# Patient Record
Sex: Female | Born: 1975 | Race: White | Hispanic: No | Marital: Single | Smoking: Current every day smoker
Health system: Southern US, Community
[De-identification: ages and names within clinical notes are randomized; demographics above are authoritative.]

## PROBLEM LIST (undated history)

## (undated) DIAGNOSIS — F32A Depression, unspecified: Secondary | ICD-10-CM

## (undated) DIAGNOSIS — F329 Major depressive disorder, single episode, unspecified: Secondary | ICD-10-CM

## (undated) HISTORY — PX: TUBAL LIGATION: SHX77

---

## 2018-09-07 ENCOUNTER — Other Ambulatory Visit: Payer: Self-pay

## 2018-09-07 ENCOUNTER — Ambulatory Visit
Admission: EM | Admit: 2018-09-07 | Discharge: 2018-09-07 | Disposition: A | Discharge status: Home / Self Care | Payer: Self-pay | Attending: Family Medicine | Admitting: Family Medicine

## 2018-09-07 DIAGNOSIS — J069 Acute upper respiratory infection, unspecified: Secondary | ICD-10-CM | POA: Insufficient documentation

## 2018-09-07 DIAGNOSIS — B9789 Other viral agents as the cause of diseases classified elsewhere: Secondary | ICD-10-CM | POA: Insufficient documentation

## 2018-09-07 HISTORY — DX: Depression, unspecified: F32.A

## 2018-09-07 HISTORY — DX: Major depressive disorder, single episode, unspecified: F32.9

## 2018-09-07 LAB — RAPID STREP SCREEN (MED CTR MEBANE ONLY): Streptococcus, Group A Screen (Direct): NEGATIVE

## 2018-09-07 NOTE — ED Provider Notes (Signed)
[facility]    CSN: [id number] Arrival date & time: 09/07/18  1134     History   Chief Complaint Chief Complaint  Patient presents with  . Sore Throat    HPI Sandra Bradshaw is a 43 y.o. female.   The history is provided by the patient.  Sore Throat   URI  Presenting symptoms: congestion, rhinorrhea and sore throat   Severity:  Moderate Onset quality:  Sudden Duration:  6 hours Timing:  Constant Progression:  Unchanged Chronicity:  New Relieved by:  None tried Ineffective treatments:  None tried Associated symptoms: no wheezing   Risk factors: sick contacts   Risk factors: not elderly and no chronic cardiac disease     Past Medical History:  Diagnosis Date  . Depression     There are no active problems to display for this patient.   Past Surgical History:  Procedure Laterality Date  . TUBAL LIGATION      OB History   No obstetric history on file.      Home Medications    Prior to Admission medications   Not on File    Family History History reviewed. No pertinent family history.  Social History Social History   Tobacco Use  . Smoking status: Former Smoker    Last attempt to quit: 08/19/2018    Years since quitting: 0.0  . Smokeless tobacco: Never Used  Substance Use Topics  . Alcohol use: Yes    Comment: occasional  . Drug use: Not Currently     Allergies   Doxycycline and Codeine   Review of Systems Review of Systems  HENT: Positive for congestion, rhinorrhea and sore throat.   Respiratory: Negative for wheezing.      Physical Exam Triage Vital Signs ED Triage Vitals  Enc Vitals Group     BP 09/07/18 1158 129/86     Pulse Rate 09/07/18 1158 70     Resp 09/07/18 1158 16     Temp 09/07/18 1158 98.4 F (36.9 C)     Temp Source 09/07/18 1158 Oral     SpO2 09/07/18 1158 100 %     Weight 09/07/18 1156 167 lb (75.8 kg)     Height 09/07/18 1156 5\' 7"  (1.702 m)     Head Circumference --      Peak Flow --    Pain Score 09/07/18 1156 4     Pain Loc --      Pain Edu? --      Excl. in GC? --    No data found.  Updated Vital Signs BP 129/86 (BP Location: Left Arm)   Pulse 70   Temp 98.4 F (36.9 C) (Oral)   Resp 16   Ht 5\' 7"  (1.702 m)   Wt 75.8 kg   LMP 08/10/2018   SpO2 100%   BMI 26.16 kg/m   Visual Acuity Right Eye Distance:   Left Eye Distance:   Bilateral Distance:    Right Eye Near:   Left Eye Near:    Bilateral Near:     Physical Exam Vitals signs and nursing note reviewed.  Constitutional:      General: She is not in acute distress.    Appearance: She is well-developed. She is not diaphoretic.  HENT:     Head: Normocephalic and atraumatic.     Right Ear: Tympanic membrane, ear canal and external ear normal.     Left Ear: Tympanic membrane, ear canal and external ear normal.  Nose: Rhinorrhea present.     Mouth/Throat:     Pharynx: Uvula midline. Posterior oropharyngeal erythema present. No oropharyngeal exudate.  Neck:     Musculoskeletal: Normal range of motion and neck supple.     Thyroid: No thyromegaly.  Cardiovascular:     Rate and Rhythm: Normal rate and regular rhythm.     Heart sounds: Normal heart sounds.  Pulmonary:     Effort: Pulmonary effort is normal. No respiratory distress.     Breath sounds: Normal breath sounds. No stridor. No wheezing, rhonchi or rales.  Lymphadenopathy:     Cervical: No cervical adenopathy.      UC Treatments / Results  Labs (all labs ordered are listed, but only abnormal results are displayed) Labs Reviewed  RAPID STREP SCREEN ([facility] ONLY)  CULTURE, GROUP A STREP [facility])    EKG None  Radiology No results found.  Procedures Procedures (including critical care time)  Medications Ordered in UC Medications - No data to display  Initial Impression / Assessment and Plan / UC Course  I have reviewed the triage vital signs and the nursing notes.  Pertinent labs & imaging results that were  available during my care of the patient were reviewed by me and considered in my medical decision making (see chart for details).      Final Clinical Impressions(s) / UC Diagnoses   Final diagnoses:  Viral URI with cough    ED Prescriptions    None     1. Lab results and diagnosis reviewed with patient 2. Recommend supportive treatment with rest, fluids, otc meds prn 3. Follow-up prn if symptoms worsen or don't improve   Controlled Substance Prescriptions  Controlled Substance Registry consulted? Not Applicable   Payton Mccallum, MD 09/07/18 [phone]

## 2018-09-07 NOTE — ED Triage Notes (Signed)
Pt awoke today with sore, red throat, swollen lymph nodes and nausea. No fever.

## 2018-09-10 LAB — CULTURE, GROUP A STREP (THRC)

## 2020-07-17 ENCOUNTER — Ambulatory Visit
Admission: EM | Admit: 2020-07-17 | Discharge: 2020-07-17 | Disposition: A | Discharge status: Home / Self Care | Payer: Self-pay | Attending: Emergency Medicine | Admitting: Emergency Medicine

## 2020-07-17 ENCOUNTER — Other Ambulatory Visit: Payer: Self-pay

## 2020-07-17 DIAGNOSIS — Z87891 Personal history of nicotine dependence: Secondary | ICD-10-CM | POA: Insufficient documentation

## 2020-07-17 DIAGNOSIS — Z881 Allergy status to other antibiotic agents status: Secondary | ICD-10-CM | POA: Insufficient documentation

## 2020-07-17 DIAGNOSIS — J40 Bronchitis, not specified as acute or chronic: Secondary | ICD-10-CM | POA: Insufficient documentation

## 2020-07-17 DIAGNOSIS — R059 Cough, unspecified: Secondary | ICD-10-CM | POA: Insufficient documentation

## 2020-07-17 DIAGNOSIS — Z20822 Contact with and (suspected) exposure to covid-19: Secondary | ICD-10-CM | POA: Insufficient documentation

## 2020-07-17 DIAGNOSIS — Z885 Allergy status to narcotic agent status: Secondary | ICD-10-CM | POA: Insufficient documentation

## 2020-07-17 LAB — RESP PANEL BY RT-PCR (FLU A&B, COVID) ARPGX2
Influenza A by PCR: NEGATIVE
Influenza B by PCR: NEGATIVE
SARS Coronavirus 2 by RT PCR: NEGATIVE

## 2020-07-17 MED ORDER — PROMETHAZINE-DM 6.25-15 MG/5ML PO SYRP: 5.0000 mL | ORAL_SOLUTION | Freq: Four times a day (QID) | ORAL | 0 refills | Status: DC | PRN

## 2020-07-17 MED ORDER — PREDNISONE 10 MG (21) PO TBPK: ORAL_TABLET | Freq: Every day | ORAL | 0 refills | Status: DC

## 2020-07-17 MED ORDER — BENZONATATE 100 MG PO CAPS: 200.0000 mg | ORAL_CAPSULE | Freq: Three times a day (TID) | ORAL | 0 refills | Status: DC

## 2020-07-17 NOTE — Discharge Instructions (Addendum)
Take the prednisone according to the package insert.  Use the Tessalon Perles during the day for cough, take them with a small sip of water.  Use the Promethazine DM at bedtime for cough, congestion, and sleep.  Increase oral fluid intake to keep your mucus thin.  If your symptoms persist follow-up with your primary care provider.

## 2020-07-17 NOTE — ED Triage Notes (Signed)
Patient states that she has been having cough with thick mucus x 5 days. States that she was covid tested on Wednesday when her only symptom was sneezing. Patient states that she has not been feeling well and has some sinus issues. Will re swab for covid and flu today.

## 2020-07-17 NOTE — ED Provider Notes (Signed)
[facility]    CSN: [id number] Arrival date & time: 07/17/20  1157      History   Chief Complaint Chief Complaint  Patient presents with  . Cough    HPI Sandra Bradshaw is a 44 y.o. female.   HPI   44 year old female here for evaluation of productive cough.  Patient reports that she has had symptoms for the past 5 days.  She states her cough is worse when she is laying flat and that she is bringing up green mucus.  Patient has had some sinus pain and complains of pain little hard of hearing the past few days.  She also complaining of chest tightness.  Patient denies fever, nausea, vomiting, diarrhea, shortness of breath, or wheezing.  Patient has not received her flu shot or her Covid vaccine.  Past Medical History:  Diagnosis Date  . Depression     There are no problems to display for this patient.   Past Surgical History:  Procedure Laterality Date  . TUBAL LIGATION      OB History   No obstetric history on file.      Home Medications    Prior to Admission medications   Medication Sig Start Date End Date Taking? Authorizing Provider  benzonatate (TESSALON) 100 MG capsule Take 2 capsules (200 mg total) by mouth every 8 (eight) hours. 07/17/20   Becky Augusta, NP  predniSONE (STERAPRED UNI-PAK 21 TAB) 10 MG (21) TBPK tablet Take by mouth daily. Take 6 tabs by mouth daily  for 2 days, then 5 tabs for 2 days, then 4 tabs for 2 days, then 3 tabs for 2 days, 2 tabs for 2 days, then 1 tab by mouth daily for 2 days 07/17/20   Becky Augusta, NP  promethazine-dextromethorphan (PROMETHAZINE-DM) 6.25-15 MG/5ML syrup Take 5 mLs by mouth 4 (four) times daily as needed. 07/17/20   Becky Augusta, NP    Family History History reviewed. No pertinent family history.  Social History Social History   Tobacco Use  . Smoking status: Former Smoker    Quit date: 08/19/2018    Years since quitting: 1.9  . Smokeless tobacco: Never Used  Vaping Use  . Vaping Use: Every  day  Substance Use Topics  . Alcohol use: Yes    Comment: occasional  . Drug use: Not Currently     Allergies   Doxycycline and Codeine   Review of Systems Review of Systems  Constitutional: Negative for activity change, appetite change and fever.  HENT: Positive for congestion, hearing loss, sinus pressure and sinus pain. Negative for rhinorrhea and sore throat.   Respiratory: Positive for cough and chest tightness. Negative for shortness of breath and wheezing.   Cardiovascular: Negative for chest pain.  Gastrointestinal: Negative for diarrhea, nausea and vomiting.  Musculoskeletal: Negative for arthralgias and myalgias.  Skin: Negative for rash.  Neurological: Negative for syncope and headaches.  Hematological: Negative.   Psychiatric/Behavioral: Negative.      Physical Exam Triage Vital Signs ED Triage Vitals  Enc Vitals Group     BP --      Pulse --      Resp 07/17/20 1521 18     Temp --      Temp Source 07/17/20 1521 Oral     SpO2 --      Weight 07/17/20 1519 180 lb (81.6 kg)     Height 07/17/20 1519 5\' 8"  (1.727 m)     Head Circumference --  Peak Flow --      Pain Score 07/17/20 1519 2     Pain Loc --      Pain Edu? --      Excl. in GC? --    No data found.  Updated Vital Signs BP (!) 152/73 (BP Location: Right Arm)   Pulse 72   Temp 98.2 F (36.8 C) (Oral)   Resp 18   Ht 5\' 8"  (1.727 m)   Wt 180 lb (81.6 kg)   LMP 06/23/2020   SpO2 98%   BMI 27.37 kg/m   Visual Acuity Right Eye Distance:   Left Eye Distance:   Bilateral Distance:    Right Eye Near:   Left Eye Near:    Bilateral Near:     Physical Exam Vitals and nursing note reviewed.  Constitutional:      General: She is not in acute distress.    Appearance: Normal appearance. She is not toxic-appearing.  HENT:     Head: Normocephalic and atraumatic.     Right Ear: Tympanic membrane, ear canal and external ear normal.     Left Ear: Tympanic membrane, ear canal and external ear  normal.     Nose: Rhinorrhea present. No congestion.     Comments: Nasal mucosa is mildly edematous without erythema.  There is scant clear nasal discharge present.    Mouth/Throat:     Mouth: Mucous membranes are moist.     Pharynx: Oropharynx is clear. Posterior oropharyngeal erythema present. No oropharyngeal exudate.     Comments: Posterior oropharynx has mild erythema with clear postnasal drip.  No injection. Eyes:     General: No scleral icterus.    Extraocular Movements: Extraocular movements intact.     Conjunctiva/sclera: Conjunctivae normal.     Pupils: Pupils are equal, round, and reactive to light.  Cardiovascular:     Rate and Rhythm: Normal rate and regular rhythm.     Pulses: Normal pulses.     Heart sounds: Normal heart sounds. No murmur heard.  No gallop.   Pulmonary:     Effort: Pulmonary effort is normal.     Breath sounds: Normal breath sounds. No wheezing, rhonchi or rales.  Musculoskeletal:        General: No swelling or tenderness. Normal range of motion.     Cervical back: Normal range of motion and neck supple. No tenderness.  Lymphadenopathy:     Cervical: No cervical adenopathy.  Skin:    General: Skin is warm and dry.     Capillary Refill: Capillary refill takes less than 2 seconds.     Coloration: Skin is not jaundiced.     Findings: No erythema, lesion or rash.  Neurological:     General: No focal deficit present.     Mental Status: She is alert and oriented to person, place, and time.  Psychiatric:        Mood and Affect: Mood normal.     Comments: Patient is very antsy and unable to sit still during interview and exam.  Patient also having a hard time recounting the timeline of her illness.      UC Treatments / Results  Labs (all labs ordered are listed, but only abnormal results are displayed) Labs Reviewed  RESP PANEL BY RT-PCR (FLU A&B, COVID) [id number]    EKG   Radiology No results found.  Procedures Procedures (including critical  care time)  Medications Ordered in UC Medications - No data to display  Initial Impression /  Assessment and Plan / UC Course  I have reviewed the triage vital signs and the nursing notes.  Pertinent labs & imaging results that were available during my care of the patient were reviewed by me and considered in my medical decision making (see chart for details).   Patient is here for evaluation of a productive cough for thick green sputum that she has had for the past 5 days.  Patient has not had a fever.  She does complain of some sinus pain and stating that gets been trouble hearing in the past few days.  Physical exam is largely unremarkable.  Patient does work in a [employer].  Patient was tested for Covid 3 days ago and was negative.  Will recheck for Covid and flu giving that patient has had neither vaccine but it is less likely given her lack of fever.  Symptoms more consistent with forming bronchitis as patient is a smoker.  We will send [facility]plex respiratory panel.  [facility]plex respiratory panel is negative.  Will discharge patient home with treatment for bronchitis with prednisone, Tessalon Perles, and Promethazine DM.   Final Clinical Impressions(s) / UC Diagnoses   Final diagnoses:  Bronchitis     Discharge Instructions     Take the prednisone according to the package insert.  Use the Tessalon Perles during the day for cough, take them with a small sip of water.  Use the Promethazine DM at bedtime for cough, congestion, and sleep.  Increase oral fluid intake to keep your mucus thin.  If your symptoms persist follow-up with your primary care provider.    ED Prescriptions    Medication Sig Dispense Auth. Provider   predniSONE (STERAPRED UNI-PAK 21 TAB) 10 MG (21) TBPK tablet Take by mouth daily. Take 6 tabs by mouth daily  for 2 days, then 5 tabs for 2 days, then 4 tabs for 2 days, then 3 tabs for 2 days, 2 tabs for 2 days, then 1 tab by mouth daily for 2 days 42 tablet  Becky Augusta, NP   benzonatate (TESSALON) 100 MG capsule Take 2 capsules (200 mg total) by mouth every 8 (eight) hours. 21 capsule Becky Augusta, NP   promethazine-dextromethorphan (PROMETHAZINE-DM) 6.25-15 MG/5ML syrup Take 5 mLs by mouth 4 (four) times daily as needed. 118 mL Becky Augusta, NP     PDMP not reviewed this encounter.   Becky Augusta, NP 07/17/20 1631

## 2021-06-09 ENCOUNTER — Emergency Department: Payer: Self-pay

## 2021-06-09 ENCOUNTER — Other Ambulatory Visit: Payer: Self-pay

## 2021-06-09 DIAGNOSIS — S01111A Laceration without foreign body of right eyelid and periocular area, initial encounter: Secondary | ICD-10-CM | POA: Insufficient documentation

## 2021-06-09 DIAGNOSIS — Z23 Encounter for immunization: Secondary | ICD-10-CM | POA: Insufficient documentation

## 2021-06-09 DIAGNOSIS — Z87891 Personal history of nicotine dependence: Secondary | ICD-10-CM | POA: Insufficient documentation
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
## 2021-06-10 ENCOUNTER — Emergency Department
Admission: EM | Admit: 2021-06-10 | Discharge: 2021-06-10 | Disposition: A | Discharge status: Home / Self Care | Payer: Self-pay | Attending: Emergency Medicine | Admitting: Emergency Medicine

## 2021-06-10 DIAGNOSIS — S0990XA Unspecified injury of head, initial encounter: Secondary | ICD-10-CM

## 2021-06-10 DIAGNOSIS — S0181XA Laceration without foreign body of other part of head, initial encounter: Secondary | ICD-10-CM

## 2021-06-10 MED ORDER — FLUORESCEIN SODIUM 1 MG OP STRP
1.0000 | ORAL_STRIP | Freq: Once | OPHTHALMIC | Status: AC
  Administered 2021-06-10: 1 via OPHTHALMIC
  Filled 2021-06-10: qty 1

## 2021-06-10 MED ORDER — IBUPROFEN 600 MG PO TABS
600.0000 mg | ORAL_TABLET | Freq: Once | ORAL | Status: AC
  Administered 2021-06-10: 600 mg via ORAL
  Filled 2021-06-10: qty 1

## 2021-06-10 MED ORDER — TETANUS-DIPHTH-ACELL PERTUSSIS 5-2.5-18.5 LF-MCG/0.5 IM SUSY
0.5000 mL | PREFILLED_SYRINGE | Freq: Once | INTRAMUSCULAR | Status: AC
  Administered 2021-06-10: 0.5 mL via INTRAMUSCULAR
  Filled 2021-06-10: qty 0.5

## 2021-06-10 MED ORDER — OXYCODONE HCL 5 MG PO TABS
5.0000 mg | ORAL_TABLET | Freq: Once | ORAL | Status: AC
  Administered 2021-06-10: 5 mg via ORAL
  Filled 2021-06-10: qty 1

## 2021-06-10 MED ORDER — ACETAMINOPHEN 500 MG PO TABS
1000.0000 mg | ORAL_TABLET | Freq: Once | ORAL | Status: AC
  Administered 2021-06-10: 1000 mg via ORAL
  Filled 2021-06-10: qty 2

## 2021-06-10 MED ORDER — LIDOCAINE HCL (PF) 1 % IJ SOLN
10.0000 mL | Freq: Once | INTRAMUSCULAR | Status: AC
  Administered 2021-06-10: 10 mL
  Filled 2021-06-10: qty 10

## 2021-06-10 MED ORDER — TETRACAINE HCL 0.5 % OP SOLN
1.0000 [drp] | Freq: Once | OPHTHALMIC | Status: AC
  Administered 2021-06-10: 1 [drp] via OPHTHALMIC
  Filled 2021-06-10: qty 4

## 2021-06-10 NOTE — ED Notes (Signed)
Pt left without paperwork
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
## 2021-06-10 NOTE — Discharge Instructions (Signed)
The stitches will need to be removed in about 7-10 days.  This can be done at any doctor's office, urgent care or out front in the emergency room.  If you develop any severe pain, inability to see or use your right eyeball, please return to the ED.  If you develop any fevers or pus coming from the forehead wound, please return to the ED.  Please take Tylenol and ibuprofen/Advil for your pain.  It is safe to take them together, or to alternate them every few hours.  Take up to 1000mg  of Tylenol at a time, up to 4 times per day.  Do not take more than 4000 mg of Tylenol in 24 hours.  For ibuprofen, take 400-600 mg, 4-5 times per day.

## 2022-07-03 ENCOUNTER — Encounter: Payer: Self-pay | Admitting: Emergency Medicine

## 2022-07-03 ENCOUNTER — Ambulatory Visit
Admission: EM | Admit: 2022-07-03 | Discharge: 2022-07-03 | Disposition: A | Discharge status: Home / Self Care | Payer: 59

## 2022-07-03 ENCOUNTER — Ambulatory Visit (INDEPENDENT_AMBULATORY_CARE_PROVIDER_SITE_OTHER): Payer: 59

## 2022-07-03 DIAGNOSIS — R1084 Generalized abdominal pain: Secondary | ICD-10-CM | POA: Diagnosis not present

## 2022-07-03 DIAGNOSIS — K5900 Constipation, unspecified: Secondary | ICD-10-CM | POA: Diagnosis not present

## 2022-07-03 DIAGNOSIS — R109 Unspecified abdominal pain: Secondary | ICD-10-CM

## 2022-07-03 MED ORDER — LACTULOSE 10 GM/15ML PO SOLN: 10.0000 g | Freq: Two times a day (BID) | ORAL | 0 refills | Status: DC | PRN

## 2022-07-03 NOTE — Discharge Instructions (Addendum)
-  X-ray shows that you are constipated but no bowel blockages. - Continue to exercise and increase her fluid intake.  Start a daily fiber supplement like Metamucil.  I have sent lactulose to the pharmacy.  Make sure you are drinking plenty of fluids with this medication. - If you are still having issues with constipation in the next week, please follow-up with your PCP as you may need a referral to GI and possible colonoscopy. - If you have increased abdominal pain, fever or stop being able to produce a BM at all even with laxatives, go to ER.

## 2022-07-03 NOTE — ED Provider Notes (Signed)
[facility]    CSN: [id number] Arrival date & time: 07/03/22  1313      History   Chief Complaint Chief Complaint  Patient presents with   Constipation    HPI Sandra Bradshaw is a 46 y.o. female presenting for issues with constipation for the past 1 to 1-1/2 weeks.  She states she started to have abdominal discomfort and noticed that she had not had a BM in a week.  Reports that she took MiraLAX, stool softeners, Senokot and also her father's lactulose and she was finally able to have a BM on Saturday after taking 2 doses of lactulose.  Reports she has not had a BM since Saturday and believes she is still constipated.  She reports increased abdominal swelling and cramping.  Generalized abdominal pain.  Reports no issues with constipation.  No changes to her medications or foods.  States that she drinks tons of fluids and walks throughout the day.  States she is following every recommendation she can but is still having issues producing a BM.  No history of GI issues.  No fever, fatigue, nausea/vomiting.  No report of rectal pain or bleeding.  HPI  Past Medical History:  Diagnosis Date   Depression     There are no problems to display for this patient.   Past Surgical History:  Procedure Laterality Date   TUBAL LIGATION      OB History   No obstetric history on file.      Home Medications    Prior to Admission medications   Medication Sig Start Date End Date Taking? Authorizing Provider  cloNIDine (CATAPRES) 0.1 MG tablet Take 0.1 mg by mouth at bedtime.   Yes [provider]  lactulose (CHRONULAC) 10 GM/15ML solution Take 15 mLs (10 g total) by mouth 2 (two) times daily as needed for mild constipation. 07/03/22  Yes Laurene Footman B, PA-C  liraglutide (VICTOZA) 18 MG/3ML SOPN Inject into the skin daily.   Yes [provider]  losartan (COZAAR) 50 MG tablet Take 50 mg by mouth daily.   Yes [provider]  phentermine (ADIPEX-P) 37.5  MG tablet Take 37.5 mg by mouth daily before breakfast.   Yes [provider]  benzonatate (TESSALON) 100 MG capsule Take 2 capsules (200 mg total) by mouth every 8 (eight) hours. 07/17/20   Margarette Canada, NP  predniSONE (STERAPRED UNI-PAK 21 TAB) 10 MG (21) TBPK tablet Take by mouth daily. Take 6 tabs by mouth daily  for 2 days, then 5 tabs for 2 days, then 4 tabs for 2 days, then 3 tabs for 2 days, 2 tabs for 2 days, then 1 tab by mouth daily for 2 days 07/17/20   Margarette Canada, NP  promethazine-dextromethorphan (PROMETHAZINE-DM) 6.25-15 MG/5ML syrup Take 5 mLs by mouth 4 (four) times daily as needed. 07/17/20   Margarette Canada, NP    Family History No family history on file.  Social History Social History   Tobacco Use   Smoking status: Every Day    Types: Cigarettes    Last attempt to quit: 08/19/2018    Years since quitting: 3.8   Smokeless tobacco: Never  Vaping Use   Vaping Use: Every day  Substance Use Topics   Alcohol use: Yes    Comment: occasional   Drug use: Not Currently     Allergies   Doxycycline, Codeine, and Latex   Review of Systems Review of Systems  Constitutional:  Negative for fatigue and fever.  Gastrointestinal:  Positive for abdominal distention, abdominal pain and constipation. Negative for anal bleeding, blood in stool, diarrhea, nausea, rectal pain and vomiting.  Genitourinary:  Negative for difficulty urinating and flank pain.  Musculoskeletal:  Negative for back pain.  Neurological:  Negative for weakness.     Physical Exam Triage Vital Signs ED Triage Vitals  Enc Vitals Group     BP 07/03/22 1349 116/81     Pulse Rate 07/03/22 1349 71     Resp 07/03/22 1349 16     Temp 07/03/22 1349 97.8 F (36.6 C)     Temp Source 07/03/22 1349 Oral     SpO2 07/03/22 1349 97 %     Weight 07/03/22 1346 184 lb 15.5 oz (83.9 kg)     Height 07/03/22 1346 5\' 7"  (1.702 m)     Head Circumference --      Peak Flow --      Pain Score 07/03/22 1344 6      Pain Loc --      Pain Edu? --      Excl. in [place]? --    No data found.  Updated Vital Signs BP 116/81 (BP Location: Left Arm)   Pulse 71   Temp 97.8 F (36.6 C) (Oral)   Resp 16   Ht 5\' 7"  (1.702 m)   Wt 184 lb 15.5 oz (83.9 kg)   LMP 05/22/2022 (Approximate) Comment: denies preg, signed waiver,  SpO2 97%   BMI 28.97 kg/m       Physical Exam Vitals and nursing note reviewed.  Constitutional:      General: She is not in acute distress.    Appearance: Normal appearance. She is not ill-appearing or toxic-appearing.  HENT:     Head: Normocephalic and atraumatic.  Eyes:     General: No scleral icterus.       Right eye: No discharge.        Left eye: No discharge.     Conjunctiva/sclera: Conjunctivae normal.  Cardiovascular:     Rate and Rhythm: Normal rate and regular rhythm.     Heart sounds: Normal heart sounds.  Pulmonary:     Effort: Pulmonary effort is normal. No respiratory distress.     Breath sounds: Normal breath sounds.  Abdominal:     General: Bowel sounds are normal.     Palpations: Abdomen is soft.     Tenderness: There is abdominal tenderness (generalized).  Musculoskeletal:     Cervical back: Neck supple.  Skin:    General: Skin is dry.  Neurological:     General: No focal deficit present.     Mental Status: She is alert. Mental status is at baseline.     Motor: No weakness.     Gait: Gait normal.  Psychiatric:        Mood and Affect: Mood normal.        Behavior: Behavior normal.        Thought Content: Thought content normal.      UC Treatments / Results  Labs (all labs ordered are listed, but only abnormal results are displayed) Labs Reviewed - No data to display  EKG   Radiology DG Abdomen 1 View  Result Date: 07/03/2022 CLINICAL DATA:  Abdominal pain and swelling. Constipation. Symptoms for 1 week. EXAM: ABDOMEN - 1 VIEW COMPARISON:  None Available. FINDINGS: Two supine views of the abdomen are submitted. There is prominent stool  within the transverse colon. The bowel gas pattern is nonobstructive. There is no  supine evidence of bowel wall thickening or pneumoperitoneum. Punctate calcifications in the pelvis are likely phleboliths. The bones appear unremarkable. IMPRESSION: Prominent stool in the transverse colon consistent with constipation. No evidence of bowel obstruction. Electronically Signed   By: Carey Bullocks M.D.   On: 07/03/2022 14:03    Procedures Procedures (including critical care time)  Medications Ordered in UC Medications - No data to display  Initial Impression / Assessment and Plan / UC Course  I have reviewed the triage vital signs and the nursing notes.  Pertinent labs & imaging results that were available during my care of the patient were reviewed by me and considered in my medical decision making (see chart for details).   46 year old female presents for constipation issues for the past 1 to 1-1/2 weeks.  Reports being constipated for 1 week and taking numerous laxatives and stool softeners and eventually her father's lactulose medication and being able to produce a BM 3 days ago.  No BM since.  Reports continued abdominal cramping and pain.  Reports that she took lactulose yesterday again but has not had a BM since.  No fever, vomiting  Vitals normal and stable patient is overall well-appearing.  Normal bowel sounds, may be increased in the upper left quadrant.  Abdomen is soft and she does have generalized tenderness to palpation.  KUB ordered to assess for possible SBO.  KUB shows prominent stool but no obstruction.  Discussed results of imaging with patient.  Encouraged her to stay hydrated and take daily fiber supplement as well as exercise regularly.  Will prescribe lactulose since that seems to be only thing that has helped her.  Encouraged her to hydrate well with this medication.  Advised her to follow-up with her PCP if she still having constipation issues that she may need a colonoscopy.   ED precautions reviewed.   Final Clinical Impressions(s) / UC Diagnoses   Final diagnoses:  Constipation, unspecified constipation type  Generalized abdominal pain     Discharge Instructions      -X-ray shows that you are constipated but no bowel blockages. - Continue to exercise and increase her fluid intake.  Start a daily fiber supplement like Metamucil.  I have sent lactulose to the pharmacy.  Make sure you are drinking plenty of fluids with this medication. - If you are still having issues with constipation in the next week, please follow-up with your PCP as you may need a referral to GI and possible colonoscopy. - If you have increased abdominal pain, fever or stop being able to produce a BM at all even with laxatives, go to ER.     ED Prescriptions     Medication Sig Dispense Auth. Provider   lactulose (CHRONULAC) 10 GM/15ML solution Take 15 mLs (10 g total) by mouth 2 (two) times daily as needed for mild constipation. 237 mL Shirlee Latch, PA-C      PDMP not reviewed this encounter.   Shirlee Latch, PA-C 07/03/22 1433

## 2022-07-03 NOTE — ED Triage Notes (Signed)
Pt c/o constipation. Started about a week ago. She states she has tried miralax, stool softener, her fathers constipation medication. She was able to have a BM 4 days ago but has not been able to since. She states her abdomen hurts and is swollen.
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
## 2024-05-17 ENCOUNTER — Other Ambulatory Visit: Payer: Self-pay | Admitting: Medical Genetics

## 2024-05-24 ENCOUNTER — Other Ambulatory Visit: Payer: Self-pay

## 2024-05-24 ENCOUNTER — Other Ambulatory Visit
Admission: RE | Admit: 2024-05-24 | Discharge: 2024-05-24 | Disposition: A | Discharge status: Home / Self Care | Payer: Self-pay | Source: Ambulatory Visit | Attending: Medical Genetics | Admitting: Medical Genetics

## 2024-06-04 LAB — GENECONNECT MOLECULAR SCREEN: Genetic Analysis Overall Interpretation: NEGATIVE

## 2024-09-12 ENCOUNTER — Encounter (HOSPITAL_COMMUNITY): Payer: Self-pay

## 2024-09-12 ENCOUNTER — Emergency Department (HOSPITAL_COMMUNITY)
Admission: EM | Admit: 2024-09-12 | Discharge: 2024-09-13 | Disposition: A | Payer: MEDICAID | Attending: Emergency Medicine | Admitting: Emergency Medicine

## 2024-09-12 ENCOUNTER — Emergency Department (HOSPITAL_COMMUNITY): Payer: MEDICAID

## 2024-09-12 ENCOUNTER — Other Ambulatory Visit: Payer: Self-pay

## 2024-09-12 DIAGNOSIS — R45851 Suicidal ideations: Secondary | ICD-10-CM | POA: Insufficient documentation

## 2024-09-12 DIAGNOSIS — Z91040 Latex allergy status: Secondary | ICD-10-CM | POA: Insufficient documentation

## 2024-09-12 DIAGNOSIS — S0592XA Unspecified injury of left eye and orbit, initial encounter: Secondary | ICD-10-CM | POA: Diagnosis present

## 2024-09-12 DIAGNOSIS — S0012XA Contusion of left eyelid and periocular area, initial encounter: Secondary | ICD-10-CM | POA: Insufficient documentation

## 2024-09-12 DIAGNOSIS — Y907 Blood alcohol level of 200-239 mg/100 ml: Secondary | ICD-10-CM | POA: Diagnosis not present

## 2024-09-12 DIAGNOSIS — F10921 Alcohol use, unspecified with intoxication delirium: Secondary | ICD-10-CM

## 2024-09-12 DIAGNOSIS — F10121 Alcohol abuse with intoxication delirium: Secondary | ICD-10-CM | POA: Diagnosis not present

## 2024-09-12 LAB — COMPREHENSIVE METABOLIC PANEL WITH GFR
ALT: 15 U/L (ref 0–44)
AST: 24 U/L (ref 15–41)
Albumin: 4.5 g/dL (ref 3.5–5.0)
Alkaline Phosphatase: 76 U/L (ref 38–126)
Anion gap: 19 — ABNORMAL HIGH (ref 5–15)
BUN: 14 mg/dL (ref 6–20)
CO2: 17 mmol/L — ABNORMAL LOW (ref 22–32)
Calcium: 8.6 mg/dL — ABNORMAL LOW (ref 8.9–10.3)
Chloride: 109 mmol/L (ref 98–111)
Creatinine, Ser: 0.9 mg/dL (ref 0.44–1.00)
GFR, Estimated: >60 mL/min
Glucose, Bld: 93 mg/dL (ref 70–99)
Potassium: 3.2 mmol/L — ABNORMAL LOW (ref 3.5–5.1)
Sodium: 145 mmol/L (ref 135–145)
Total Bilirubin: 0.5 mg/dL (ref 0.0–1.2)
Total Protein: 7.2 g/dL (ref 6.5–8.1)

## 2024-09-12 LAB — CBC WITH DIFFERENTIAL/PLATELET
Abs Immature Granulocytes: 0.04 10*3/uL (ref 0.00–0.07)
Basophils Absolute: 0.1 10*3/uL (ref 0.0–0.1)
Basophils Relative: 1 %
Eosinophils Absolute: 0.3 10*3/uL (ref 0.0–0.5)
Eosinophils Relative: 3 %
HCT: 43.5 % (ref 36.0–46.0)
Hemoglobin: 14.5 g/dL (ref 12.0–15.0)
Immature Granulocytes: 0 %
Lymphocytes Relative: 19 %
Lymphs Abs: 2 10*3/uL (ref 0.7–4.0)
MCH: 30.6 pg (ref 26.0–34.0)
MCHC: 33.3 g/dL (ref 30.0–36.0)
MCV: 91.8 fL (ref 80.0–100.0)
Monocytes Absolute: 0.6 10*3/uL (ref 0.1–1.0)
Monocytes Relative: 6 %
Neutro Abs: 7.3 10*3/uL (ref 1.7–7.7)
Neutrophils Relative %: 71 %
Platelets: 336 10*3/uL (ref 150–400)
RBC: 4.74 MIL/uL (ref 3.87–5.11)
RDW: 13.2 % (ref 11.5–15.5)
WBC: 10.3 10*3/uL (ref 4.0–10.5)
nRBC: 0 % (ref 0.0–0.2)

## 2024-09-12 LAB — URINE DRUG SCREEN
Amphetamines: POSITIVE — AB
Barbiturates: NEGATIVE
Benzodiazepines: NEGATIVE
Cocaine: NEGATIVE
Fentanyl: NEGATIVE
Methadone Scn, Ur: NEGATIVE
Opiates: NEGATIVE
Tetrahydrocannabinol: POSITIVE — AB

## 2024-09-12 LAB — ACETAMINOPHEN LEVEL: Acetaminophen (Tylenol), Serum: <10 ug/mL — ABNORMAL LOW (ref 10–30)

## 2024-09-12 LAB — SALICYLATE LEVEL: Salicylate Lvl: <7 mg/dL — ABNORMAL LOW (ref 7.0–30.0)

## 2024-09-12 LAB — ETHANOL: Alcohol, Ethyl (B): 206 mg/dL — ABNORMAL HIGH

## 2024-09-12 MED ORDER — STERILE WATER FOR INJECTION IJ SOLN
INTRAMUSCULAR | Status: AC
  Filled 2024-09-12: qty 10

## 2024-09-12 MED ORDER — SODIUM CHLORIDE 0.9 % IV BOLUS
1000.0000 mL | Freq: Once | INTRAVENOUS | Status: AC
  Administered 2024-09-12: 1000 mL via INTRAVENOUS

## 2024-09-12 MED ORDER — ZIPRASIDONE MESYLATE 20 MG IM SOLR
20.0000 mg | Freq: Once | INTRAMUSCULAR | Status: AC
  Administered 2024-09-12: 20 mg via INTRAMUSCULAR
  Filled 2024-09-12: qty 20
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
## 2024-09-12 NOTE — ED Notes (Signed)
 Patient transported to CT
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
## 2024-09-13 ENCOUNTER — Inpatient Hospital Stay
Admission: RE | Admit: 2024-09-13 | Discharge: 2024-09-17 | Disposition: A | Discharge status: Home / Self Care | Payer: MEDICAID | Attending: Psychiatry | Admitting: Psychiatry

## 2024-09-13 ENCOUNTER — Encounter: Payer: Self-pay | Admitting: Psychiatry

## 2024-09-13 DIAGNOSIS — F322 Major depressive disorder, single episode, severe without psychotic features: Principal | ICD-10-CM | POA: Diagnosis present

## 2024-09-13 MED ORDER — HYDROXYZINE HCL 25 MG PO TABS: 25.0000 mg | ORAL_TABLET | Freq: Four times a day (QID) | ORAL | Status: DC | PRN

## 2024-09-13 MED ORDER — MAGNESIUM HYDROXIDE 400 MG/5ML PO SUSP: 30.0000 mL | Freq: Every day | ORAL | Status: DC | PRN

## 2024-09-13 MED ORDER — ALUM & MAG HYDROXIDE-SIMETH 200-200-20 MG/5ML PO SUSP: 30.0000 mL | ORAL | Status: DC | PRN

## 2024-09-13 MED ORDER — LORAZEPAM 1 MG PO TABS: 1.0000 mg | ORAL_TABLET | Freq: Four times a day (QID) | ORAL | Status: AC | PRN

## 2024-09-13 MED ORDER — HALOPERIDOL 5 MG PO TABS: 5.0000 mg | ORAL_TABLET | Freq: Three times a day (TID) | ORAL | Status: DC | PRN

## 2024-09-13 MED ORDER — TRAZODONE HCL 50 MG PO TABS: 50.0000 mg | ORAL_TABLET | Freq: Every evening | ORAL | Status: DC | PRN

## 2024-09-13 MED ORDER — ACETAMINOPHEN 325 MG PO TABS
650.0000 mg | ORAL_TABLET | Freq: Four times a day (QID) | ORAL | Status: DC | PRN
  Administered 2024-09-14 – 2024-09-17 (×3): 650 mg via ORAL
  Filled 2024-09-13 (×3): qty 2

## 2024-09-13 MED ORDER — ONDANSETRON 4 MG PO TBDP: 4.0000 mg | ORAL_TABLET | Freq: Four times a day (QID) | ORAL | Status: DC | PRN

## 2024-09-13 MED ORDER — ADULT MULTIVITAMIN W/MINERALS CH
1.0000 | ORAL_TABLET | Freq: Every day | ORAL | Status: DC
  Administered 2024-09-15 – 2024-09-17 (×3): 1 via ORAL
  Filled 2024-09-13 (×4): qty 1

## 2024-09-13 MED ORDER — HALOPERIDOL LACTATE 5 MG/ML IJ SOLN: 10.0000 mg | Freq: Three times a day (TID) | INTRAMUSCULAR | Status: DC | PRN

## 2024-09-13 MED ORDER — THIAMINE MONONITRATE 100 MG PO TABS
100.0000 mg | ORAL_TABLET | Freq: Every day | ORAL | Status: DC
  Administered 2024-09-15 – 2024-09-17 (×3): 100 mg via ORAL
  Filled 2024-09-13 (×4): qty 1

## 2024-09-13 MED ORDER — DIPHENHYDRAMINE HCL 50 MG/ML IJ SOLN: 50.0000 mg | Freq: Three times a day (TID) | INTRAMUSCULAR | Status: DC | PRN

## 2024-09-13 MED ORDER — HALOPERIDOL LACTATE 5 MG/ML IJ SOLN: 5.0000 mg | Freq: Three times a day (TID) | INTRAMUSCULAR | Status: DC | PRN

## 2024-09-13 MED ORDER — DIPHENHYDRAMINE HCL 25 MG PO CAPS: 50.0000 mg | ORAL_CAPSULE | Freq: Three times a day (TID) | ORAL | Status: DC | PRN

## 2024-09-13 MED ORDER — LORAZEPAM 2 MG/ML IJ SOLN: 2.0000 mg | Freq: Three times a day (TID) | INTRAMUSCULAR | Status: DC | PRN

## 2024-09-13 MED ORDER — NICOTINE 14 MG/24HR TD PT24
14.0000 mg | MEDICATED_PATCH | Freq: Every day | TRANSDERMAL | Status: DC
  Administered 2024-09-15 – 2024-09-17 (×3): 14 mg via TRANSDERMAL
  Filled 2024-09-13 (×4): qty 1

## 2024-09-13 MED ORDER — OXYCODONE-ACETAMINOPHEN 5-325 MG PO TABS
1.0000 | ORAL_TABLET | Freq: Once | ORAL | Status: AC
  Administered 2024-09-13: 1 via ORAL
  Filled 2024-09-13: qty 1

## 2024-09-13 MED ORDER — HYDROXYZINE HCL 25 MG PO TABS: 25.0000 mg | ORAL_TABLET | Freq: Three times a day (TID) | ORAL | Status: DC | PRN

## 2024-09-13 MED ORDER — LOPERAMIDE HCL 2 MG PO CAPS: 2.0000 mg | ORAL_CAPSULE | ORAL | Status: AC | PRN
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
## 2024-09-13 NOTE — ED Notes (Signed)
 Behavior Health Urgent Care recommended for inpatient.

## 2024-09-13 NOTE — Group Note (Signed)
 Date:  09/13/2024 Time:  8:53 PM  Group Topic/Focus:  Coping With Mental Health Crisis:   The purpose of this group is to help patients identify strategies for coping with mental health crisis.  Group discusses possible causes of crisis and ways to manage them effectively.    Participation Level:  Did Not Attend  Participation Quality:  Attentive  Affect:  Not Congruent  Cognitive:  Lacking  Insight: None  Engagement in Group:  None  Modes of Intervention:  none  Additional Comments:    Gwendlyn LITTIE Sharps 09/13/2024, 8:53 PM

## 2024-09-13 NOTE — ED Notes (Signed)
 Report called to receiving RN.

## 2024-09-13 NOTE — ED Notes (Signed)
 Patient transported to [facility] via Psychologist, Educational.

## 2024-09-13 NOTE — Progress Notes (Signed)
 Pt has been accepted to [facility] BMU on 09/13/2024 . Bed assignment:314   Pt meets inpatient criteria per Elveria Batter, NP   Attending Physician will be Dr.Jadepalle    Report can be called to: [phone]  Pt can arrive : [facility] will update   Care Team Notified: [facility] Cherylynn Ernst, RN, Celestine Cain, Paramedic, Tully DELENA Comer, RN, Elveria Batter, NP

## 2024-09-13 NOTE — ED Provider Notes (Signed)
 Patient has been accepted to [facility] behavioral health unit.  Accepting attending is Dr. Yves.  Safe transport coming to get patient.  EMTALA filled out.   Towana Ozell BROCKS, MD 09/13/24 [phone]

## 2024-09-13 NOTE — ED Provider Notes (Signed)
" °  Physical Exam  BP 116/72   Pulse 78   Temp 97.7 F (36.5 C)   Resp 14   SpO2 99%   Physical Exam  Procedures  Procedures  ED Course / MDM   Clinical Course as of 09/13/24 1410  Sun Sep 12, 2024  2154 Patient repeatedly screaming out 'I'm worthless' and punching herself in the head. Will give Geodon  for agitation and plan TTS evaluation when medical clear and sober.  [CS]  2224 UDS positive for amphetamines and THC. Has Rx for Adderal.  [CS]  2251 CBC is normal.  [CS]  2259 ASA, APAP negative. EtOH is elevated consistent with history.  [CS]  2304 CMP with mild metabolic acidosis, likely from EtOH. Will give a liter of fluids while awaiting sobering.  [CS]  2331 I personally viewed the images from radiology studies and agree with radiologist interpretation: CT neg for significant injury [CS]  Mon Sep 13, 2024  0142 Patient continues to sleep soundly in no distress.  [CS]    Clinical Course User Index [CS] Roselyn Carlin NOVAK, MD   Medical Decision Making Amount and/or Complexity of Data Reviewed Labs: ordered. Radiology: ordered.  Risk Prescription drug management.   TTA has recommended inpatient treatment.       Patsey Lot, MD 09/13/24 1410  "

## 2024-09-14 DIAGNOSIS — F329 Major depressive disorder, single episode, unspecified: Secondary | ICD-10-CM

## 2024-09-14 MED ORDER — IBUPROFEN 600 MG PO TABS
600.0000 mg | ORAL_TABLET | Freq: Three times a day (TID) | ORAL | Status: DC | PRN
  Administered 2024-09-14 – 2024-09-16 (×4): 600 mg via ORAL
  Filled 2024-09-14 (×4): qty 1

## 2024-09-14 MED ORDER — LORAZEPAM 1 MG PO TABS: 1.0000 mg | ORAL_TABLET | Freq: Three times a day (TID) | ORAL | Status: DC | PRN

## 2024-09-14 MED ORDER — LORAZEPAM 2 MG/ML IJ SOLN: 2.0000 mg | Freq: Three times a day (TID) | INTRAMUSCULAR | Status: DC | PRN

## 2024-09-14 NOTE — Progress Notes (Signed)
" °   09/14/24 1300  Psych Admission Type (Psych Patients Only)  Admission Status Voluntary  Psychosocial Assessment  Patient Complaints Anxiety  Eye Contact Brief  Facial Expression Flat  Affect Anxious  Speech Logical/coherent  Interaction Assertive  Motor Activity Slow  Appearance/Hygiene Unremarkable  Behavior Characteristics Cooperative  Mood Anxious  Aggressive Behavior  Effect No apparent injury  Thought Process  Coherency WDL  Content WDL  Delusions None reported or observed  Perception WDL  Hallucination None reported or observed  Judgment Poor  Confusion WDL  Danger to Self  Current suicidal ideation? Denies  Agreement Not to Harm Self Yes  Description of Agreement verbal  Danger to Others  Danger to Others None reported or observed    "
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
## 2024-09-14 NOTE — Group Note (Signed)
 Date:  09/14/2024 Time:  9:17 PM  Group Topic/Focus:  Wrap-Up Group:   The focus of this group is to help patients review their daily goal of treatment and discuss progress on daily workbooks.    Participation Level:  Active  Participation Quality:  Appropriate and Attentive  Affect:  Appropriate  Cognitive:  Alert and Appropriate  Insight: Appropriate  Engagement in Group:  Engaged  Modes of Intervention:  Discussion and Support  Additional Comments:    [name] 09/14/2024, 9:17 PM

## 2024-09-14 NOTE — Group Note (Signed)
 Recreation Therapy Group Note   Group Topic:Coping Skills  Group Date: 09/14/2024 Start Time: 1000 End Time: 1040 Facilitators: Celestia Jeoffrey BRAVO, LRT, CTRS Location: Craft Room  Group Description: Mind Map.  Patient was provided a blank template of a diagram with 32 blank boxes in a tiered system, branching from the center (similar to a bubble chart). LRT directed patients to label the middle of the diagram Coping Skills. LRT and patients then came up with 8 different coping skills as examples. Pt were directed to record their coping skills in the 2nd tier boxes closest to the center.  Patients would then share their coping skills with the group as LRT wrote them out. LRT gave a handout of 99 different coping skills at the end of group.   Goal Area(s) Addressed: Patients will be able to define coping skills. Patient will identify new coping skills.  Patient will increase communication.   Affect/Mood: N/A   Participation Level: Did not attend    Clinical Observations/Individualized Feedback: Patient did not attend.  Plan: Continue to engage patient in RT group sessions 2-3x/week.   Jeoffrey BRAVO Celestia, LRT, CTRS 09/14/2024 10:44 AM

## 2024-09-14 NOTE — Group Note (Signed)
 Recreation Therapy Group Note   Group Topic:Other  Group Date: 09/14/2024 Start Time: 1530 End Time: 1535 Facilitators: Celestia Gauze E, LRT, CTRS  Group therapy was not conducted due to staffing restrictions. LRT was engaged in holding the board and completing required safety checks, while the other MHT on the unit was completing admissions and walking discharges up.   Plan: Continue to engage patient in RT group sessions 2-3x/week.   Gauze FORBES Celestia, LRT, CTRS 09/14/2024 5:09 PM

## 2024-09-14 NOTE — Group Note (Signed)
 LCSW Group Therapy Note  Group Date: 09/14/2024 Start Time: 1210 End Time: 1300   Type of Therapy and Topic:  Group Therapy: Positive Affirmations  Participation Level:  Did Not Attend   Description of Group:   This group addressed positive affirmation towards self and others.  Patients went around the room and identified two positive things about themselves and two positive things about a peer in the room.  Patients reflected on how it felt to share something positive with others, to identify positive things about themselves, and to hear positive things from others/ Patients were encouraged to have a daily reflection of positive characteristics or circumstances.   Therapeutic Goals: Patients will verbalize two of their positive qualities Patients will demonstrate empathy for others by stating two positive qualities about a peer in the group Patients will verbalize their feelings when voicing positive self affirmations and when voicing positive affirmations of others Patients will discuss the potential positive impact on their wellness/recovery of focusing on positive traits of self and others.  Summary of Patient Progress:  Patient did not attend.   Therapeutic Modalities:   Cognitive Behavioral Therapy Motivational Interviewing    Sandra Bradshaw, LCSWA 09/14/2024  2:05 PM
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
## 2024-09-14 NOTE — Plan of Care (Addendum)
 Pt refused her medications, she states she is allergic to trazodone , NP Tanya informed. Got Prn ibuprofen  for pain around the left eye and headache. EKG completed and NP notified.   Problem: Education: Goal: Knowledge of [facility] Education information/materials will improve Outcome: Progressing Goal: Emotional status will improve Outcome: Progressing Goal: Mental status will improve Outcome: Progressing Goal: Verbalization of understanding the information provided will improve Outcome: Progressing   Problem: Activity: Goal: Interest or engagement in activities will improve Outcome: Progressing Goal: Sleeping patterns will improve Outcome: Progressing   Problem: Coping: Goal: Ability to verbalize frustrations and anger appropriately will improve Outcome: Progressing Goal: Ability to demonstrate self-control will improve Outcome: Progressing   Problem: Health Behavior/Discharge Planning: Goal: Identification of resources available to assist in meeting health care needs will improve Outcome: Progressing Goal: Compliance with treatment plan for underlying cause of condition will improve Outcome: Progressing   Problem: Physical Regulation: Goal: Ability to maintain clinical measurements within normal limits will improve Outcome: Progressing   Problem: Safety: Goal: Periods of time without injury will increase Outcome: Progressing

## 2024-09-14 NOTE — B[facility] Counselor (Signed)
 Patient declining CSW assistance for aftercare appointments.   Sherryle Margo, MSW, LCSW 09/14/2024 4:06 PM

## 2024-09-14 NOTE — Plan of Care (Signed)
   Problem: Education: Goal: Knowledge of [facility] Education information/materials will improve Outcome: Progressing Goal: Emotional status will improve Outcome: Progressing Goal: Mental status will improve Outcome: Progressing Goal: Verbalization of understanding the information provided will improve Outcome: Progressing   Problem: Activity: Goal: Interest or engagement in activities will improve Outcome: Progressing Goal: Sleeping patterns will improve Outcome: Progressing   Problem: Coping: Goal: Ability to verbalize frustrations and anger appropriately will improve Outcome: Progressing Goal: Ability to demonstrate self-control will improve Outcome: Progressing

## 2024-09-14 NOTE — Group Note (Signed)
 Date:  09/14/2024 Time:  9:56 AM  Group Topic/Focus:  Orientation:   The focus of this group is to educate the patient on the purpose and policies of crisis stabilization and provide a format to answer questions about their admission.  The group details unit policies and expectations of patients while admitted.    Participation Level:  Did Not Attend   Sandra Bradshaw June 09/14/2024, 9:56 AM

## 2024-09-15 ENCOUNTER — Encounter: Payer: Self-pay | Admitting: Psychiatry

## 2024-09-15 MED ORDER — DULOXETINE HCL 30 MG PO CPEP
30.0000 mg | ORAL_CAPSULE | Freq: Every day | ORAL | Status: DC
  Administered 2024-09-16 – 2024-09-17 (×2): 30 mg via ORAL
  Filled 2024-09-15 (×2): qty 1

## 2024-09-15 MED ORDER — NICOTINE POLACRILEX 2 MG MT GUM
2.0000 mg | CHEWING_GUM | OROMUCOSAL | Status: DC | PRN
  Administered 2024-09-16: 2 mg via ORAL
  Filled 2024-09-15: qty 1

## 2024-09-15 MED ORDER — BUSPIRONE HCL 5 MG PO TABS
5.0000 mg | ORAL_TABLET | Freq: Two times a day (BID) | ORAL | Status: DC | PRN
  Administered 2024-09-16: 5 mg via ORAL
  Filled 2024-09-15: qty 1

## 2024-09-15 MED ORDER — MELATONIN 5 MG PO TABS: 5.0000 mg | ORAL_TABLET | Freq: Every evening | ORAL | Status: DC | PRN

## 2024-09-15 NOTE — BH IP Treatment Plan (Addendum)
 Interdisciplinary Treatment and Diagnostic Plan Update  09/15/2024 Time of Session: 10:37 AM Kadey Mihalic MRN: [id number]  Principal Diagnosis: MDD (major depressive disorder), severe (HCC)  Secondary Diagnoses: Principal Problem:   MDD (major depressive disorder), severe (HCC)   Current Medications:  Current Facility-Administered Medications  Medication Dose Route Frequency Provider Last Rate Last Admin   acetaminophen  (TYLENOL ) tablet 650 mg  650 mg Oral Q6H PRN Coleman, Carolyn H, NP   650 mg at 09/14/24 1720   alum & mag hydroxide-simeth (MAALOX/MYLANTA) 200-200-20 MG/5ML suspension 30 mL  30 mL Oral Q4H PRN Coleman, Carolyn H, NP       ibuprofen  (ADVIL ) tablet 600 mg  600 mg Oral Q8H PRN Smith, Annie B, NP   600 mg at 09/15/24 0902   loperamide  (IMODIUM ) capsule 2-4 mg  2-4 mg Oral PRN Mardy Elveria DEL, NP       LORazepam  (ATIVAN ) tablet 1 mg  1 mg Oral Q8H PRN Smith, Annie B, NP       Or   LORazepam  (ATIVAN ) injection 2 mg  2 mg Intramuscular Q8H PRN Smith, Annie B, NP       LORazepam  (ATIVAN ) tablet 1 mg  1 mg Oral Q6H PRN Coleman, Carolyn H, NP       magnesium  hydroxide (MILK OF MAGNESIA) suspension 30 mL  30 mL Oral Daily PRN Coleman, Carolyn H, NP       multivitamin with minerals tablet 1 tablet  1 tablet Oral Daily Mardy Elveria DEL, NP   1 tablet at 09/15/24 0901   nicotine  (NICODERM CQ  - dosed in mg/24 hours) patch 14 mg  14 mg Transdermal Daily Jadapalle, Sree, MD   14 mg at 09/15/24 0901   thiamine  (VITAMIN B1) tablet 100 mg  100 mg Oral Daily Coleman, Carolyn H, NP   100 mg at 09/15/24 0901   PTA Medications: Medications Prior to Admission  Medication Sig Dispense Refill Last Dose/Taking   amphetamine-dextroamphetamine (ADDERALL) 5 MG tablet Take 1 tablet by mouth 2 (two) times daily.      cyclobenzaprine (FLEXERIL) 10 MG tablet Take 10 mg by mouth 2 (two) times daily. Do not take scheduled because of interaction with other medications      hydrOXYzine  (ATARAX ) 25  MG tablet Take 10 mg by mouth as needed for anxiety or itching.      ibuprofen  (ADVIL ) 800 MG tablet Take 800 mg by mouth every 8 (eight) hours as needed for moderate pain (pain score 4-6).      levonorgestrel (MIRENA, 52 MG,) 20 MCG/DAY IUD 1 each by Intrauterine route once.      Multiple Vitamin (MULTI-VITAMIN) tablet Take 1 tablet by mouth daily.       Patient Stressors:    Patient Strengths:    Treatment Modalities: Medication Management, Group therapy, Case management,  1 to 1 session with clinician, Psychoeducation, Recreational therapy.   Physician Treatment Plan for Primary Diagnosis: MDD (major depressive disorder), severe (HCC) Long Term Goal(s): Improvement in symptoms so as ready for discharge   Short Term Goals: Ability to identify changes in lifestyle to reduce recurrence of condition will improve Ability to verbalize feelings will improve Ability to demonstrate self-control will improve Ability to identify and develop effective coping behaviors will improve Ability to identify triggers associated with substance abuse/mental health issues will improve  Medication Management: Evaluate patient's response, side effects, and tolerance of medication regimen.  Therapeutic Interventions: 1 to 1 sessions, Unit Group sessions and Medication administration.  Evaluation of Outcomes:  Not Met  Physician Treatment Plan for Secondary Diagnosis: Principal Problem:   MDD (major depressive disorder), severe (HCC)  Long Term Goal(s): Improvement in symptoms so as ready for discharge   Short Term Goals: Ability to identify changes in lifestyle to reduce recurrence of condition will improve Ability to verbalize feelings will improve Ability to demonstrate self-control will improve Ability to identify and develop effective coping behaviors will improve Ability to identify triggers associated with substance abuse/mental health issues will improve     Medication Management: Evaluate  patient's response, side effects, and tolerance of medication regimen.  Therapeutic Interventions: 1 to 1 sessions, Unit Group sessions and Medication administration.  Evaluation of Outcomes: Not Met   RN Treatment Plan for Primary Diagnosis: MDD (major depressive disorder), severe (HCC) Long Term Goal(s): Knowledge of disease and therapeutic regimen to maintain health will improve  Short Term Goals: Ability to remain free from injury will improve, Ability to verbalize frustration and anger appropriately will improve, Ability to demonstrate self-control, Ability to participate in decision making will improve, Ability to verbalize feelings will improve, Ability to disclose and discuss suicidal ideas, Ability to identify and develop effective coping behaviors will improve, and Compliance with prescribed medications will improve  Medication Management: RN will administer medications as ordered by provider, will assess and evaluate patient's response and provide education to patient for prescribed medication. RN will report any adverse and/or side effects to prescribing provider.  Therapeutic Interventions: 1 on 1 counseling sessions, Psychoeducation, Medication administration, Evaluate responses to treatment, Monitor vital signs and CBGs as ordered, Perform/monitor CIWA, COWS, AIMS and Fall Risk screenings as ordered, Perform wound care treatments as ordered.  Evaluation of Outcomes: Not Met   LCSW Treatment Plan for Primary Diagnosis: MDD (major depressive disorder), severe (HCC) Long Term Goal(s): Safe transition to appropriate next level of care at discharge, Engage patient in therapeutic group addressing interpersonal concerns.  Short Term Goals: Engage patient in aftercare planning with referrals and resources, Increase social support, Increase ability to appropriately verbalize feelings, Increase emotional regulation, Facilitate acceptance of mental health diagnosis and concerns, Facilitate  patient progression through stages of change regarding substance use diagnoses and concerns, Identify triggers associated with mental health/substance abuse issues, and Increase skills for wellness and recovery  Therapeutic Interventions: Assess for all discharge needs, 1 to 1 time with Social worker, Explore available resources and support systems, Assess for adequacy in community support network, Educate family and significant other(s) on suicide prevention, Complete Psychosocial Assessment, Interpersonal group therapy.  Evaluation of Outcomes: Not Met   Progress in Treatment: Attending groups: No. Participating in groups: No. Taking medication as prescribed: Yes. Toleration medication: Yes. Family/Significant other contact made: No, will contact:  when given permission.  Patient understands diagnosis: Yes. Discussing patient identified problems/goals with staff: Yes. Medical problems stabilized or resolved: Yes. Denies suicidal/homicidal ideation: Yes. Issues/concerns per patient self-inventory: No. Other: none.   New problem(s) identified: No, Describe:  none identified.  New Short Term/Long Term Goal(s): medication management for mood stabilization; elimination of SI thoughts; development of comprehensive mental wellness/sobriety plan.  Patient Goals: I'm just resting it up.    Discharge Plan or Barriers: CSW will assist pt with development of an appropriate aftercare/discharge plan.  Reason for Continuation of Hospitalization: Depression Medication stabilization Suicidal ideation  Estimated Length of Stay: 1-7 days  Last 3 [place] Suicide Severity Risk Score: Flowsheet Row Admission (Current) from 09/13/2024 in [facility] INPATIENT BEHAVIORAL MEDICINE ED from 09/12/2024 in [facility] Emergency Department at [facility]  Hospital UC from 07/03/2022 in [facility] at [facility]   C-SSRS RISK CATEGORY No Risk No Risk No Risk    Last PHQ 2/9 Scores:     No data to display           Scribe for Treatment Team: Nadara JONELLE Fam, LCSW 09/15/2024 10:56 AM

## 2024-09-15 NOTE — Progress Notes (Signed)
" °   09/15/24 1558  Psych Admission Type (Psych Patients Only)  Admission Status Voluntary/72 hour document signed  Date 72 hour document signed  09/15/24  Time 72 hour document signed  1556  Provider Notified (First and Last Name) (see details for LINK to note) Glenys Beal, NP    "

## 2024-09-15 NOTE — Plan of Care (Signed)

## 2024-09-15 NOTE — Group Note (Signed)
 [facility] LCSW Group Therapy Note   Group Date: 09/15/2024 Start Time: 1300 End Time: 1440   Type of Therapy/Topic:  Group Therapy:  Emotion Regulation  Participation Level:  Active   Mood:  Description of Group:    The purpose of this group is to assist patients in learning to regulate negative emotions and experience positive emotions. Patients will be guided to discuss ways in which they have been vulnerable to their negative emotions. These vulnerabilities will be juxtaposed with experiences of positive emotions or situations, and patients challenged to use positive emotions to combat negative ones. Special emphasis will be placed on coping with negative emotions in conflict situations, and patients will process healthy conflict resolution skills.  Therapeutic Goals: Patient will identify two positive emotions or experiences to reflect on in order to balance out negative emotions:  Patient will label two or more emotions that they find the most difficult to experience:  Patient will be able to demonstrate positive conflict resolution skills through discussion or role plays:   Summary of Patient Progress: Patient was present for the entirety of the group discussion. She was actively engaged in the discussion and his comments/feedback helped further the conversation. Pt appeared to have significant insight into herself and the topic. She appeared open and receptive to feedback/comments from both her peers and the facilitator.   Therapeutic Modalities:   Cognitive Behavioral Therapy Feelings Identification Dialectical Behavioral Therapy   Nadara JONELLE Fam, LCSW
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
## 2024-09-15 NOTE — Group Note (Signed)
 Date:  09/15/2024 Time:  9:00 PM  Group Topic/Focus:  Wrap-Up Group:   The focus of this group is to help patients review their daily goal of treatment and discuss progress on daily workbooks.    Participation Level:  Active  Participation Quality:  Appropriate and Attentive  Affect:  Appropriate  Cognitive:  Alert and Appropriate  Insight: Appropriate and Good  Engagement in Group:  Engaged  Modes of Intervention:  Orientation  Additional Comments:     Arlester CHRISTELLA Servant 09/15/2024, 9:00 PM

## 2024-09-15 NOTE — Group Note (Signed)
 Date:  09/15/2024 Time:  10:13 AM  Group Topic/Focus:  Goals Group:   The focus of this group is to help patients establish daily goals to achieve during treatment and discuss how the patient can incorporate goal setting into their daily lives to aide in recovery.    Participation Level:  Did Not Attend   Sandra Bradshaw 09/15/2024, 10:13 AM

## 2024-09-15 NOTE — Progress Notes (Signed)
" °   09/14/24 2100  Psych Admission Type (Psych Patients Only)  Admission Status Voluntary  Psychosocial Assessment  Patient Complaints Anxiety  Eye Contact Brief  Facial Expression Flat  Affect Anxious  Speech Logical/coherent  Interaction Assertive  Motor Activity Slow  Appearance/Hygiene Unremarkable  Behavior Characteristics Cooperative  Mood Anxious  Aggressive Behavior  Effect No apparent injury  Thought Process  Coherency WDL  Content WDL  Delusions None reported or observed  Perception WDL  Hallucination None reported or observed  Judgment Poor  Confusion WDL  Danger to Self  Current suicidal ideation? Denies  Agreement Not to Harm Self Yes  Danger to Others  Danger to Others None reported or observed    "

## 2024-09-15 NOTE — Progress Notes (Signed)
" °   09/15/24 1500  Psych Admission Type (Psych Patients Only)  Admission Status Voluntary  Psychosocial Assessment  Patient Complaints Anxiety;Sleep disturbance (patient stated she didn't sleep as good as the night before; patient stated she is anxious and can't focus, basically stating she isn't getting her correct medication)  Eye Contact Fair  Facial Expression Worried;Anxious  Affect Anxious;Preoccupied (patient fixated on her medication)  Speech Logical/coherent  Interaction Assertive  Motor Activity Slow  Appearance/Hygiene Unremarkable  Behavior Characteristics Cooperative;Appropriate to situation  Mood Anxious;Pleasant  Aggressive Behavior  Effect No apparent injury  Thought Process  Coherency WDL  Content WDL  Delusions None reported or observed  Perception WDL  Hallucination None reported or observed  Judgment WDL  Confusion None  Danger to Self  Current suicidal ideation? Denies  Agreement Not to Harm Self Yes  Description of Agreement Verbal  Danger to Others  Danger to Others None reported or observed    "

## 2024-09-16 MED ORDER — SUMATRIPTAN SUCCINATE 50 MG PO TABS
50.0000 mg | ORAL_TABLET | Freq: Two times a day (BID) | ORAL | Status: DC | PRN
  Administered 2024-09-16 – 2024-09-17 (×2): 50 mg via ORAL
  Filled 2024-09-16 (×2): qty 1

## 2024-09-16 MED ORDER — IBUPROFEN 600 MG PO TABS
600.0000 mg | ORAL_TABLET | Freq: Three times a day (TID) | ORAL | Status: DC | PRN
  Administered 2024-09-17: 600 mg via ORAL
  Filled 2024-09-16: qty 1

## 2024-09-16 MED ORDER — HYDROXYZINE HCL 25 MG PO TABS
25.0000 mg | ORAL_TABLET | Freq: Four times a day (QID) | ORAL | Status: DC | PRN
  Administered 2024-09-16 – 2024-09-17 (×2): 25 mg via ORAL
  Filled 2024-09-16 (×2): qty 1

## 2024-09-16 NOTE — Plan of Care (Signed)

## 2024-09-16 NOTE — Group Note (Signed)
 Recreation Therapy Group Note   Group Topic:Stress Management  Group Date: 09/16/2024 Start Time: 1530 End Time: 1545 Facilitators: Celestia Jeoffrey BRAVO, LRT, CTRS Location: Dayroom  Group Description: PMR (Progressive Muscle Relaxation). LRT educates patients on what PMR is and the benefits that come from it. Patients are asked to sit with their feet flat on the floor while sitting up and all the way back in their chair, if possible. LRT and pts follow a prompt through a speaker that requires you to tense and release different muscles in their body and focus on their breathing. During session, lights are off and soft music is being played. Pts are given a stress ball to use if needed.   Goal Area(s) Addressed:  Patients will be able to describe progressive muscle relaxation.  Patient will practice using relaxation technique. Patient will identify a new coping skill.  Patient will follow multistep directions to reduce anxiety and stress.   Affect/Mood: N/A   Participation Level: Did not attend    Clinical Observations/Individualized Feedback: Patient did not attend.  Plan: Continue to engage patient in RT group sessions 2-3x/week.   Jeoffrey BRAVO Celestia, LRT, CTRS 09/16/2024 4:35 PM

## 2024-09-16 NOTE — Group Note (Signed)
 Recreation Therapy Group Note   Group Topic:Healthy Support Systems  Group Date: 09/16/2024 Start Time: 1000 End Time: 1040 Facilitators: Celestia Jeoffrey BRAVO, LRT, CTRS Location: Craft Room  Group Description: Straw Bridge. In groups or individually, patients were given 10 plastic drinking straws and an equal length of masking tape. Using the materials provided, patients were instructed to build a free-standing bridge-like structure to suspend an everyday item (ex: deck of cards) off the floor or table surface. All materials were required to be used in secondary school teacher. LRT facilitated post-activity discussion reviewing the importance of having strong and healthy support systems in our lives. LRT discussed how the people in our lives serve as the tape and the deck of cards we placed on top of our straw structure are the stressors we face in daily life. LRT and pts discussed what happens in our life when things get too heavy for us , and we don't have strong supports outside of the hospital. Pt shared 2 of their healthy supports in their life aloud in the group.   Goal Area(s) Addressed:  Patient will identify 2 healthy supports in their life. Patient will identify skills to successfully complete activity. Patient will identify correlation of this activity to life post-discharge.  Patient will build on frustration tolerance skills. Patient will increase team building and communication skills.    Affect/Mood: Appropriate   Participation Level: Active and Engaged   Participation Quality: Independent   Behavior: Calm and Cooperative   Speech/Thought Process: Coherent   Insight: Good   Judgement: Good   Modes of Intervention: STEM Activity   Patient Response to Interventions:  Attentive, Engaged, Interested , and Receptive   Education Outcome:  Acknowledges education   Clinical Observations/Individualized Feedback: Sandra Bradshaw was active in their participation of session activities and group  discussion. Pt identified my sponsor and music as healthy supports.    Plan: Continue to engage patient in RT group sessions 2-3x/week.   Jeoffrey BRAVO Celestia, LRT, CTRS 09/16/2024 10:50 AM

## 2024-09-16 NOTE — Group Note (Signed)
 Date:  09/16/2024 Time:  12:51 PM  Group Topic/Focus:  Coping With Mental Health Crisis:   The purpose of this group is to help patients identify strategies for coping with mental health crisis.  Group discusses possible causes of crisis and ways to manage them effectively. MHT also lead a craft activity upon patient request.    Participation Level:  Active  Participation Quality:  Appropriate  Affect:  Appropriate  Cognitive:  Appropriate  Insight: Appropriate  Engagement in Group:  Engaged  Modes of Intervention:  Activity  Additional Comments:    Leigh VEAR Pais 09/16/2024, 12:51 PM
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
## 2024-09-16 NOTE — [facility] Suicide Risk Assessment (Signed)
 [facility] INPATIENT:  Family/Significant Other Suicide Prevention Education  Suicide Prevention Education:  Education Completed; Delon Cole, peer, [phone],  (name of family member/significant other) has been identified by the patient as the family member/significant other with whom the patient will be residing, and identified as the person(s) who will aid the patient in the event of a mental health crisis (suicidal ideations/suicide attempt).  With written consent from the patient, the family member/significant other has been provided the following suicide prevention education, prior to the and/or following the discharge of the patient.  The suicide prevention education provided includes the following: Suicide risk factors Suicide prevention and interventions National Suicide Hotline telephone number [facility] assessment telephone number [facility] Emergency Assistance 911 [facility] and/or Residential Mobile Crisis Unit telephone number  Request made of family/significant other to: Remove weapons (e.g., guns, rifles, knives), all items previously/currently identified as safety concern.   Remove drugs/medications (over-the-counter, prescriptions, illicit drugs), all items previously/currently identified as a safety concern.  The family member/significant other verbalizes understanding of the suicide prevention education information provided.  The family member/significant other agrees to remove the items of safety concern listed above.  Sherryle JINNY Margo 09/16/2024, 12:07 PM

## 2024-09-16 NOTE — Group Note (Signed)
"                                                 [facility] LCSW Group Therapy Note    Group Date: 09/16/2024 Start Time: 1300 End Time: 1400  Type of Therapy and Topic:  Group Therapy:  Overcoming Obstacles  Participation Level:  [facility] PARTICIPATION LEVEL: Active  Mood:  Description of Group:   In this group patients will be encouraged to explore what they see as obstacles to their own wellness and recovery. They will be guided to discuss their thoughts, feelings, and behaviors related to these obstacles. The group will process together ways to cope with barriers, with attention given to specific choices patients can make. Each patient will be challenged to identify changes they are motivated to make in order to overcome their obstacles. This group will be process-oriented, with patients participating in exploration of their own experiences as well as giving and receiving support and challenge from other group members.  Therapeutic Goals: 1. Patient will identify personal and current obstacles as they relate to admission. 2. Patient will identify barriers that currently interfere with their wellness or overcoming obstacles.  3. Patient will identify feelings, thought process and behaviors related to these barriers. 4. Patient will identify two changes they are willing to make to overcome these obstacles:    Summary of Patient Progress Patient was present in group.  Patient was an active participant and engaged in group.  Patient was supportive of others.  Patient displayed fair insight.  Therapeutic Modalities:   Cognitive Behavioral Therapy Solution Focused Therapy Motivational Interviewing Relapse Prevention Therapy   Sherryle JINNY Margo, LCSW "

## 2024-09-16 NOTE — Group Note (Signed)
 Date:  09/16/2024 Time:  9:39 AM  Group Topic/Focus:  Activity Group: The focus of the group is to encourage the patients to go outside to the courtyard and get some fresh air and some exercise.   Participation Level:  Did Not Attend   Camellia HERO Tylan Briguglio 09/16/2024, 9:39 AM
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
## 2024-09-16 NOTE — Progress Notes (Signed)
" °   09/15/24 2103  Psych Admission Type (Psych Patients Only)  Admission Status Voluntary/72 hour document signed  Psychosocial Assessment  Patient Complaints Anxiety;Sleep disturbance  Eye Contact Brief  Facial Expression Flat  Affect Anxious  Speech Logical/coherent  Interaction Assertive  Motor Activity Slow  Appearance/Hygiene Unremarkable  Behavior Characteristics Appropriate to situation;Cooperative  Mood Anxious;Pleasant  Aggressive Behavior  Effect No apparent injury  Thought Process  Coherency WDL  Content WDL  Delusions None reported or observed  Perception WDL  Hallucination None reported or observed  Judgment Poor  Confusion WDL  Danger to Self  Current suicidal ideation? Denies  Agreement Not to Harm Self Yes  Danger to Others  Danger to Others None reported or observed    "

## 2024-09-16 NOTE — Progress Notes (Signed)
" °   09/16/24 1100  Psych Admission Type (Psych Patients Only)  Admission Status Voluntary/72 hour document signed  Psychosocial Assessment  Patient Complaints Anxiety;Sleep disturbance (patient stated that she forgot to take off her nicotine  patch, which kept her up.)  Eye Contact Fair  Facial Expression Worried;Anxious  Affect Anxious;Irritable (patient stated that she is a little irritable because all of my coping skills are not accessible to me. I don't have my music and I can't take a walk.)  Speech Logical/coherent  Interaction Assertive  Motor Activity Slow  Appearance/Hygiene Unremarkable  Behavior Characteristics Cooperative;Appropriate to situation  Mood Pleasant  Aggressive Behavior  Effect No apparent injury  Thought Process  Coherency WDL  Content WDL  Delusions None reported or observed  Perception WDL  Hallucination None reported or observed  Judgment WDL  Confusion None  Danger to Self  Current suicidal ideation? Denies  Agreement Not to Harm Self Yes  Description of Agreement Verbal  Danger to Others  Danger to Others None reported or observed   Patient's goal for today, per her self-inventory is restarting depression meds, open to whatever I may learn in groups or teach someone else. Also, work on discharge plan, in which she will take my meds in am and go to and participate in groups in order to achieve her goal. "

## 2024-09-16 NOTE — Group Note (Signed)
 Date:  09/16/2024 Time:  9:13 PM  Group Topic/Focus:  Wrap-Up Group:   The focus of this group is to help patients review their daily goal of treatment and discuss progress on daily workbooks.    Participation Level:  Active  Participation Quality:  Appropriate and Attentive  Affect:  Appropriate  Cognitive:  Alert and Appropriate  Insight: Appropriate  Engagement in Group:  Engaged  Modes of Intervention:  Discussion and Support  Additional Comments:    [name] 09/16/2024, 9:13 PM

## 2024-09-17 MED ORDER — BUSPIRONE HCL 5 MG PO TABS: 5.0000 mg | ORAL_TABLET | Freq: Two times a day (BID) | ORAL | 0 refills | Status: AC | PRN

## 2024-09-17 MED ORDER — DULOXETINE HCL 30 MG PO CPEP: 30.0000 mg | ORAL_CAPSULE | Freq: Every day | ORAL | 0 refills | Status: AC

## 2024-09-17 MED ORDER — NICOTINE 14 MG/24HR TD PT24: 14.0000 mg | MEDICATED_PATCH | Freq: Every day | TRANSDERMAL | 0 refills | Status: AC

## 2024-09-17 NOTE — Progress Notes (Signed)
" °  [facility] Adult Case Management Discharge Plan :  Will you be returning to the same living situation after discharge:  Yes,  pt reports that she is returning home.  At discharge, do you have transportation home?: Yes,  pts peer will provide transportation home.  Do you have the ability to pay for your medications: Yes,  [employer] / [employer]  Release of information consent forms completed and in the chart;  Patient's signature needed at discharge.  Patient to Follow up at:  Follow-up Information     [facility] Follow up.   Why: Appointment is scheduled for 09/22/2022 at 10:00AM Contact information: [address] [place], [place] [address] (Directions)  Main [phone] Pharmacy [phone] Fax [phone] After Hours [phone]        [facility] Follow up.   Why: Medication management appointment is scheduled for 10/06/2024 at 11:30AM, virtual appointment.  Therapy appointment is scheduled for 09/27/2024 at 11AM, virtual appointment.  If you need to cancel appointment please do so 48 hours in advance! Contact information: [address], [place], [place] [address]  or  [address], [place], [place] [address]  [phone]                Next level of care provider has access to [facility] Link:no  Safety Planning and Suicide Prevention discussed: Yes,  SPE completed with the patient and patient's peer     Has patient been referred to the Quitline?: Patient refused referral for treatment  Patient has been referred for addiction treatment: Yes, the patient will follow up with an outpatient provider for substance use disorder. Psychiatrist/APP: appointment made  Sherryle JINNY Margo, LCSW 09/17/2024, 1:35 PM "

## 2024-09-17 NOTE — Progress Notes (Signed)
 Patient ID: Sandra Bradshaw, female   DOB: May 10, 1976, 49 y.o.   MRN: [id number] Discharge Note:  D. Pt alert and oriented x 3. Denies SI and HI/A/VH at present.  Compliant with medications. Pt assessed by MD. Discharged home as ordered.     A.  Emotional Support offered Patient and encouragement provided. All belongings from locker  returned to Patient at the time of discharge. Discharge instructions reveiwed with Patient. Suicide information given and discussed with Patient who stated she understood and had no questions.  R.  Pt verbalized understanding related to discharge instructions. Pt signed belonging sheet in agreement with items received from locker. Denies concerns at this time ambulatory with steady gait. Appears to be in no physical distress.
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
## 2024-09-17 NOTE — Group Note (Signed)
 Date:  09/17/2024 Time:  1:01 PM  Group Topic/Focus:  Early Warning Signs:   The focus of this group is to help patients identify signs or symptoms they exhibit before slipping into an unhealthy state or crisis.    Participation Level:  Active  Participation Quality:  Appropriate  Affect:  Appropriate  Cognitive:  Appropriate  Insight: Appropriate  Engagement in Group:  Engaged  Modes of Intervention:  Activity  Additional Comments:    Sandra Bradshaw 09/17/2024, 1:01 PM

## 2024-09-17 NOTE — Plan of Care (Signed)
   Problem: Education: Goal: Knowledge of [facility] Education information/materials will improve Outcome: Progressing Goal: Emotional status will improve Outcome: Progressing Goal: Mental status will improve Outcome: Progressing

## 2024-09-17 NOTE — Group Note (Signed)
 Recreation Therapy Group Note   Group Topic:Relaxation  Group Date: 09/17/2024 Start Time: 1040 End Time: 1120 Facilitators: Celestia Jeoffrey BRAVO, LRT, CTRS Location: Dayroom  Group Description: Meditation. LRT and patients discussed what they know about meditation and mindfulness. LRT played a Deep Breathing Meditation exercise script for patients to follow along to. LRT and patients discussed how meditation and deep breathing can be used as a coping skill post--discharge to help manage symptoms of stress.   Goal Area(s) Addressed: Patient will practice using relaxation technique. Patient will identify a new coping skill.  Patient will follow multistep directions to reduce anxiety and stress.   Affect/Mood: N/A   Participation Level: Did not attend    Clinical Observations/Individualized Feedback: Patient did not attend.  Plan: Continue to engage patient in RT group sessions 2-3x/week.   Jeoffrey BRAVO Celestia, LRT, CTRS 09/17/2024 11:29 AM
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
## 2024-09-17 NOTE — Plan of Care (Signed)
